# Patient Record
Sex: Female | Born: 1990 | Race: White | Hispanic: No | Marital: Single | Smoking: Never smoker
Health system: Southern US, Community
[De-identification: ages and names within clinical notes are randomized; demographics above are authoritative.]

## PROBLEM LIST (undated history)

## (undated) DIAGNOSIS — F329 Major depressive disorder, single episode, unspecified: Secondary | ICD-10-CM

## (undated) DIAGNOSIS — F32A Depression, unspecified: Secondary | ICD-10-CM

---

## 2014-12-13 ENCOUNTER — Emergency Department (HOSPITAL_BASED_OUTPATIENT_CLINIC_OR_DEPARTMENT_OTHER): Payer: 59

## 2014-12-13 ENCOUNTER — Emergency Department (HOSPITAL_BASED_OUTPATIENT_CLINIC_OR_DEPARTMENT_OTHER)
Admission: EM | Admit: 2014-12-13 | Discharge: 2014-12-13 | Disposition: A | Discharge status: Home / Self Care | Payer: 59 | Attending: Emergency Medicine | Admitting: Emergency Medicine

## 2014-12-13 ENCOUNTER — Encounter (HOSPITAL_BASED_OUTPATIENT_CLINIC_OR_DEPARTMENT_OTHER): Payer: Self-pay

## 2014-12-13 DIAGNOSIS — Y9389 Activity, other specified: Secondary | ICD-10-CM | POA: Diagnosis not present

## 2014-12-13 DIAGNOSIS — W01198A Fall on same level from slipping, tripping and stumbling with subsequent striking against other object, initial encounter: Secondary | ICD-10-CM | POA: Insufficient documentation

## 2014-12-13 DIAGNOSIS — Z79899 Other long term (current) drug therapy: Secondary | ICD-10-CM | POA: Insufficient documentation

## 2014-12-13 DIAGNOSIS — S0990XA Unspecified injury of head, initial encounter: Secondary | ICD-10-CM | POA: Insufficient documentation

## 2014-12-13 DIAGNOSIS — S161XXA Strain of muscle, fascia and tendon at neck level, initial encounter: Secondary | ICD-10-CM | POA: Insufficient documentation

## 2014-12-13 DIAGNOSIS — Y9289 Other specified places as the place of occurrence of the external cause: Secondary | ICD-10-CM | POA: Diagnosis not present

## 2014-12-13 DIAGNOSIS — Y998 Other external cause status: Secondary | ICD-10-CM | POA: Insufficient documentation

## 2014-12-13 DIAGNOSIS — Z8659 Personal history of other mental and behavioral disorders: Secondary | ICD-10-CM | POA: Insufficient documentation

## 2014-12-13 HISTORY — DX: Depression, unspecified: F32.A

## 2014-12-13 HISTORY — DX: Major depressive disorder, single episode, unspecified: F32.9

## 2014-12-13 MED ORDER — ONDANSETRON 4 MG PO TBDP
4.0000 mg | ORAL_TABLET | Freq: Once | ORAL | Status: AC
  Administered 2014-12-13: 4 mg via ORAL
  Filled 2014-12-13: qty 1

## 2014-12-13 MED ORDER — ONDANSETRON 4 MG PO TBDP: ORAL_TABLET | ORAL | Status: AC

## 2014-12-13 NOTE — ED Notes (Signed)
Patient transported to CT 

## 2014-12-13 NOTE — Discharge Instructions (Signed)
Concussion  A concussion, or closed-head injury, is a brain injury caused by a direct blow to the head or by a quick and sudden movement (jolt) of the head or neck. Concussions are usually not life-threatening. Even so, the effects of a concussion can be serious. If you have had a concussion before, you are more likely to experience concussion-like symptoms after a direct blow to the head.   CAUSES  · Direct blow to the head, such as from running into another player during a soccer game, being hit in a fight, or hitting your head on a hard surface.  · A jolt of the head or neck that causes the brain to move back and forth inside the skull, such as in a car crash.  SIGNS AND SYMPTOMS  The signs of a concussion can be hard to notice. Early on, they may be missed by you, family members, and health care providers. You may look fine but act or feel differently.  Symptoms are usually temporary, but they may last for days, weeks, or even longer. Some symptoms may appear right away while others may not show up for hours or days. Every head injury is different. Symptoms include:  · Mild to moderate headaches that will not go away.  · A feeling of pressure inside your head.  · Having more trouble than usual:  ¨ Learning or remembering things you have heard.  ¨ Answering questions.  ¨ Paying attention or concentrating.  ¨ Organizing daily tasks.  ¨ Making decisions and solving problems.  · Slowness in thinking, acting or reacting, speaking, or reading.  · Getting lost or being easily confused.  · Feeling tired all the time or lacking energy (fatigued).  · Feeling drowsy.  · Sleep disturbances.  ¨ Sleeping more than usual.  ¨ Sleeping less than usual.  ¨ Trouble falling asleep.  ¨ Trouble sleeping (insomnia).  · Loss of balance or feeling lightheaded or dizzy.  · Nausea or vomiting.  · Numbness or tingling.  · Increased sensitivity to:  ¨ Sounds.  ¨ Lights.  ¨ Distractions.  · Vision problems or eyes that tire  easily.  · Diminished sense of taste or smell.  · Ringing in the ears.  · Mood changes such as feeling sad or anxious.  · Becoming easily irritated or angry for little or no reason.  · Lack of motivation.  · Seeing or hearing things other people do not see or hear (hallucinations).  DIAGNOSIS  Your health care provider can usually diagnose a concussion based on a description of your injury and symptoms. He or she will ask whether you passed out (lost consciousness) and whether you are having trouble remembering events that happened right before and during your injury.  Your evaluation might include:  · A brain scan to look for signs of injury to the brain. Even if the test shows no injury, you may still have a concussion.  · Blood tests to be sure other problems are not present.  TREATMENT  · Concussions are usually treated in an emergency department, in urgent care, or at a clinic. You may need to stay in the hospital overnight for further treatment.  · Tell your health care provider if you are taking any medicines, including prescription medicines, over-the-counter medicines, and natural remedies. Some medicines, such as blood thinners (anticoagulants) and aspirin, may increase the chance of complications. Also tell your health care provider whether you have had alcohol or are taking illegal drugs. This information   may affect treatment.  · Your health care provider will send you home with important instructions to follow.  · How fast you will recover from a concussion depends on many factors. These factors include how severe your concussion is, what part of your brain was injured, your age, and how healthy you were before the concussion.  · Most people with mild injuries recover fully. Recovery can take time. In general, recovery is slower in older persons. Also, persons who have had a concussion in the past or have other medical problems may find that it takes longer to recover from their current injury.  HOME  CARE INSTRUCTIONS  General Instructions  · Carefully follow the directions your health care provider gave you.  · Only take over-the-counter or prescription medicines for pain, discomfort, or fever as directed by your health care provider.  · Take only those medicines that your health care provider has approved.  · Do not drink alcohol until your health care provider says you are well enough to do so. Alcohol and certain other drugs may slow your recovery and can put you at risk of further injury.  · If it is harder than usual to remember things, write them down.  · If you are easily distracted, try to do one thing at a time. For example, do not try to watch TV while fixing dinner.  · Talk with family members or close friends when making important decisions.  · Keep all follow-up appointments. Repeated evaluation of your symptoms is recommended for your recovery.  · Watch your symptoms and tell others to do the same. Complications sometimes occur after a concussion. Older adults with a brain injury may have a higher risk of serious complications, such as a blood clot on the brain.  · Tell your teachers, school nurse, school counselor, coach, athletic trainer, or work manager about your injury, symptoms, and restrictions. Tell them about what you can or cannot do. They should watch for:  ¨ Increased problems with attention or concentration.  ¨ Increased difficulty remembering or learning new information.  ¨ Increased time needed to complete tasks or assignments.  ¨ Increased irritability or decreased ability to cope with stress.  ¨ Increased symptoms.  · Rest. Rest helps the brain to heal. Make sure you:  ¨ Get plenty of sleep at night. Avoid staying up late at night.  ¨ Keep the same bedtime hours on weekends and weekdays.  ¨ Rest during the day. Take daytime naps or rest breaks when you feel tired.  · Limit activities that require a lot of thought or concentration. These include:  ¨ Doing homework or job-related  work.  ¨ Watching TV.  ¨ Working on the computer.  · Avoid any situation where there is potential for another head injury (football, hockey, soccer, basketball, martial arts, downhill snow sports and horseback riding). Your condition will get worse every time you experience a concussion. You should avoid these activities until you are evaluated by the appropriate follow-up health care providers.  Returning To Your Regular Activities  You will need to return to your normal activities slowly, not all at once. You must give your body and brain enough time for recovery.  · Do not return to sports or other athletic activities until your health care provider tells you it is safe to do so.  · Ask your health care provider when you can drive, ride a bicycle, or operate heavy machinery. Your ability to react may be slower after a   brain injury. Never do these activities if you are dizzy.  · Ask your health care provider about when you can return to work or school.  Preventing Another Concussion  It is very important to avoid another brain injury, especially before you have recovered. In rare cases, another injury can lead to permanent brain damage, brain swelling, or death. The risk of this is greatest during the first 7-10 days after a head injury. Avoid injuries by:  · Wearing a seat belt when riding in a car.  · Drinking alcohol only in moderation.  · Wearing a helmet when biking, skiing, skateboarding, skating, or doing similar activities.  · Avoiding activities that could lead to a second concussion, such as contact or recreational sports, until your health care provider says it is okay.  · Taking safety measures in your home.  ¨ Remove clutter and tripping hazards from floors and stairways.  ¨ Use grab bars in bathrooms and handrails by stairs.  ¨ Place non-slip mats on floors and in bathtubs.  ¨ Improve lighting in dim areas.  SEEK MEDICAL CARE IF:  · You have increased problems paying attention or  concentrating.  · You have increased difficulty remembering or learning new information.  · You need more time to complete tasks or assignments than before.  · You have increased irritability or decreased ability to cope with stress.  · You have more symptoms than before.  Seek medical care if you have any of the following symptoms for more than 2 weeks after your injury:  · Lasting (chronic) headaches.  · Dizziness or balance problems.  · Nausea.  · Vision problems.  · Increased sensitivity to noise or light.  · Depression or mood swings.  · Anxiety or irritability.  · Memory problems.  · Difficulty concentrating or paying attention.  · Sleep problems.  · Feeling tired all the time.  SEEK IMMEDIATE MEDICAL CARE IF:  · You have severe or worsening headaches. These may be a sign of a blood clot in the brain.  · You have weakness (even if only in one hand, leg, or part of the face).  · You have numbness.  · You have decreased coordination.  · You vomit repeatedly.  · You have increased sleepiness.  · One pupil is larger than the other.  · You have convulsions.  · You have slurred speech.  · You have increased confusion. This may be a sign of a blood clot in the brain.  · You have increased restlessness, agitation, or irritability.  · You are unable to recognize people or places.  · You have neck pain.  · It is difficult to wake you up.  · You have unusual behavior changes.  · You lose consciousness.  MAKE SURE YOU:  · Understand these instructions.  · Will watch your condition.  · Will get help right away if you are not doing well or get worse.  Document Released: 12/23/2003 Document Revised: 10/07/2013 Document Reviewed: 04/24/2013  [employer]® Patient Information ©2015 [employer]. This information is not intended to replace advice given to you by your health care provider. Make sure you discuss any questions you have with your health care provider.

## 2014-12-13 NOTE — ED Notes (Signed)
Patient reports after seeing chiropractor she fell and hit her head Friday evening. Reports ongoing nausea and occipital head pain. Last pm vomited and continues to feel bad with nausea

## 2014-12-13 NOTE — ED Provider Notes (Signed)
CSN: [id number]     Arrival date & time 12/13/14  1228 History   First MD Initiated Contact with Patient 12/13/14 1246     Chief Complaint  Patient presents with  . Head Injury     (Consider location/radiation/quality/duration/timing/severity/associated sxs/prior Treatment) HPI Comments: Patient presents with headache and nausea. She states that 2 days ago she tripped and fell backwards hitting her head on an open animal cage. She had some minor pain to her occiput area. Yesterday she had some worsening headache with associated nausea. She states it got worse today and she felt like she was given pass out. She states nausea gets worse when she stands up. She has some pain down into her neck. She has a little bit of lightheadedness. She denies any vertigo symptoms. She denies any ataxia. She denies any vision changes. She denies any numbness or weakness to her extremities. She denies any other injuries from the fall. She vomited one time last night.  Patient is a 24 y.o. female presenting with head injury.  Head Injury Associated symptoms: headache, nausea, neck pain and vomiting   Associated symptoms: no numbness     Past Medical History  Diagnosis Date  . Depression    History reviewed. No pertinent past surgical history. No family history on file. History  Substance Use Topics  . Smoking status: Never Smoker   . Smokeless tobacco: Not on file  . Alcohol Use: Not on file   OB History    No data available     Review of Systems  Constitutional: Positive for fatigue. Negative for fever, chills and diaphoresis.  HENT: Negative for congestion, rhinorrhea and sneezing.   Eyes: Negative.   Respiratory: Negative for cough, chest tightness and shortness of breath.   Cardiovascular: Negative for chest pain and leg swelling.  Gastrointestinal: Positive for nausea and vomiting. Negative for abdominal pain, diarrhea and blood in stool.  Genitourinary: Negative for frequency, hematuria,  flank pain and difficulty urinating.  Musculoskeletal: Positive for neck pain. Negative for back pain and arthralgias.  Skin: Negative for rash.  Neurological: Positive for light-headedness and headaches. Negative for dizziness, speech difficulty, weakness and numbness.      Allergies  Review of patient's allergies indicates no known allergies.  Home Medications   Prior to Admission medications   Medication Sig Start Date End Date Taking? Authorizing Provider  norethindrone-ethinyl estradiol (JUNEL FE,GILDESS FE,LOESTRIN FE) 1-20 MG-MCG tablet Take 1 tablet by mouth daily.   Yes Historical Provider, MD  ondansetron (ZOFRAN ODT) 4 MG disintegrating tablet 4mg  ODT q4 hours prn nausea/vomit 12/13/14   Rolan BuccoMelanie Khayman Kirsch, MD  sertraline (ZOLOFT) 100 MG tablet Take 100 mg by mouth daily.   Yes Historical Provider, MD   BP 129/85 mmHg  Pulse 99  Temp(Src) 98.8 F (37.1 C) (Oral)  Resp 16  SpO2 100%  LMP 11/15/2014 Physical Exam  Constitutional: She is oriented to person, place, and time. She appears well-developed and well-nourished.  HENT:  Head: Normocephalic.  Right Ear: External ear normal.  Left Ear: External ear normal.  Mild tenderness across the posterior scalp area.  Eyes: Pupils are equal, round, and reactive to light.  Neck:  Positive tenderness to the upper cervical spine. No step-offs or deformities are noted. No pain to the thoracic or lumbosacral spine  Cardiovascular: Normal rate, regular rhythm and normal heart sounds.   Pulmonary/Chest: Effort normal and breath sounds normal. No respiratory distress. She has no wheezes. She has no rales. She exhibits no tenderness.  Abdominal: Soft. Bowel sounds are normal. There is no tenderness. There is no rebound and no guarding.  Musculoskeletal: Normal range of motion. She exhibits no edema.  Lymphadenopathy:    She has no cervical adenopathy.  Neurological: She is alert and oriented to person, place, and time. She has normal  strength. No cranial nerve deficit or sensory deficit. GCS eye subscore is 4. GCS verbal subscore is 5. GCS motor subscore is 6.  Skin: Skin is warm and dry. No rash noted.  Psychiatric: She has a normal mood and affect.    ED Course  Procedures (including critical care time) Labs Review Labs Reviewed - No data to display  Imaging Review Ct Head Wo Contrast  12/13/2014   CLINICAL DATA:  24 year old female status post fall striking the back of her head. She has occipital headache and neck pain.  EXAM: CT HEAD WITHOUT CONTRAST  CT CERVICAL SPINE WITHOUT CONTRAST  TECHNIQUE: Multidetector CT imaging of the head and cervical spine was performed following the standard protocol without intravenous contrast. Multiplanar CT image reconstructions of the cervical spine were also generated.  COMPARISON:  None.  FINDINGS: CT HEAD FINDINGS  Negative for acute intracranial hemorrhage, acute infarction, mass, mass effect, hydrocephalus or midline shift. Gray-white differentiation is preserved throughout. No focal soft tissue or calvarial abnormality. Globes and orbits are symmetric and unremarkable bilaterally. Normal aeration of the mastoid air cells and visualized paranasal sinuses.  CT CERVICAL SPINE FINDINGS  No acute fracture, malalignment or prevertebral soft tissue swelling. Unremarkable CT appearance of the thyroid gland. No acute soft tissue abnormality. The lung apices are unremarkable.  IMPRESSION: CT HEAD  1. Negative CT CSPINE  1. Negative   Electronically Signed   By: Malachy Moan M.D.   On: 12/13/2014 13:55   Ct Cervical Spine Wo Contrast  12/13/2014   CLINICAL DATA:  24 year old female status post fall striking the back of her head. She has occipital headache and neck pain.  EXAM: CT HEAD WITHOUT CONTRAST  CT CERVICAL SPINE WITHOUT CONTRAST  TECHNIQUE: Multidetector CT imaging of the head and cervical spine was performed following the standard protocol without intravenous contrast. Multiplanar  CT image reconstructions of the cervical spine were also generated.  COMPARISON:  None.  FINDINGS: CT HEAD FINDINGS  Negative for acute intracranial hemorrhage, acute infarction, mass, mass effect, hydrocephalus or midline shift. Gray-white differentiation is preserved throughout. No focal soft tissue or calvarial abnormality. Globes and orbits are symmetric and unremarkable bilaterally. Normal aeration of the mastoid air cells and visualized paranasal sinuses.  CT CERVICAL SPINE FINDINGS  No acute fracture, malalignment or prevertebral soft tissue swelling. Unremarkable CT appearance of the thyroid gland. No acute soft tissue abnormality. The lung apices are unremarkable.  IMPRESSION: CT HEAD  1. Negative CT CSPINE  1. Negative   Electronically Signed   By: Malachy Moan M.D.   On: 12/13/2014 13:55     EKG Interpretation None      MDM   Final diagnoses:  Head injury, initial encounter  Neck strain, initial encounter    Pt feeling better after some zofran.  Does not want anything for her headache.  No evidence of ICH.  No evidence of cervical injury.  No other assoicated neuro deficits.  No cranial nerve abnormalities.  Given concussion instructions.  Advised to f/u this week with her PMD in [place].  Advised to return to the ED if her symptoms worsen.    Rolan Bucco, MD 12/13/14 [id number]

## 2016-06-18 IMAGING — CT CT HEAD W/O CM
3 series · 17 of 37 positions shown, 19 images · non-contrast
Comparison: None.

CLINICAL DATA: 23-year-old female status post fall striking the
back of her head. She has occipital headache and neck pain.

EXAM:
CT HEAD WITHOUT CONTRAST
CT CERVICAL SPINE WITHOUT CONTRAST
TECHNIQUE: Multidetector CT imaging of the head and cervical spine was
performed following the standard protocol without intravenous
contrast. Multiplanar CT image reconstructions of the cervical spine
were also generated.

[Series 2: head 4.8 h37s · axial · 0.45mm/px · z∈[-162,-55]mm · 6 of 32 slices shown, 8 images]
[im 5/32  brain]
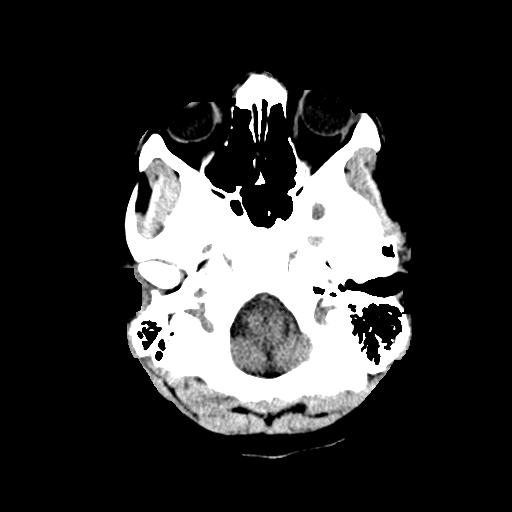
[im 5/32  bone]
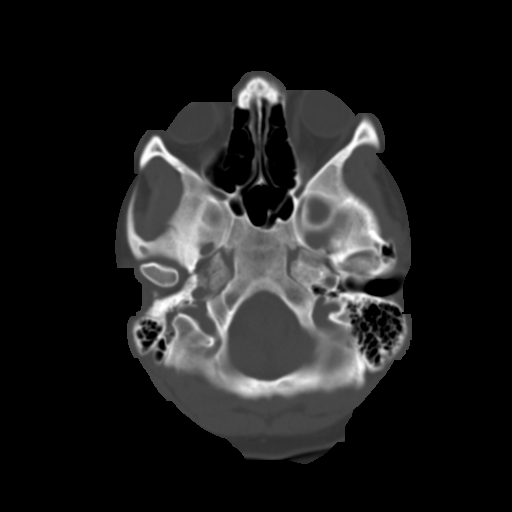
[im 9/32  brain]
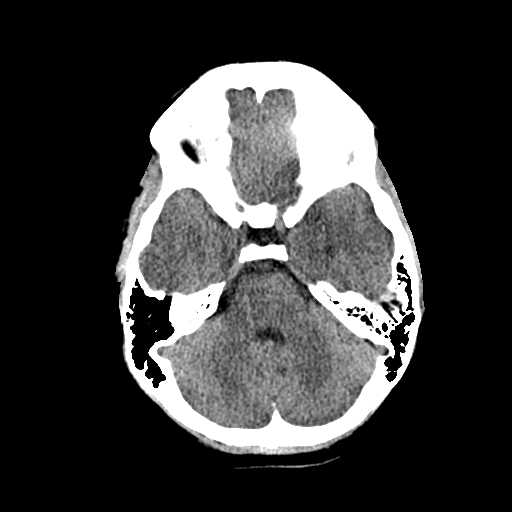
[im 14/32  brain]
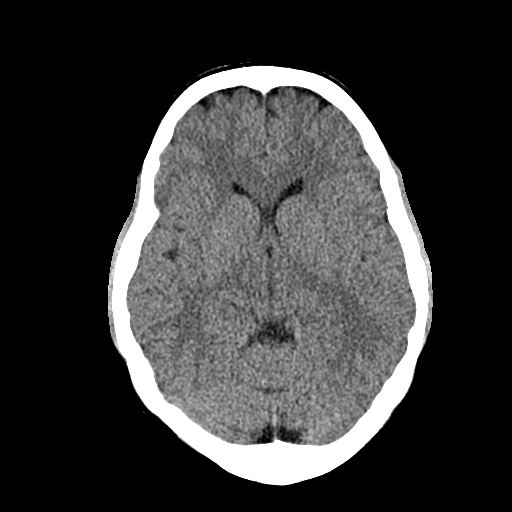
[im 18/32  brain]
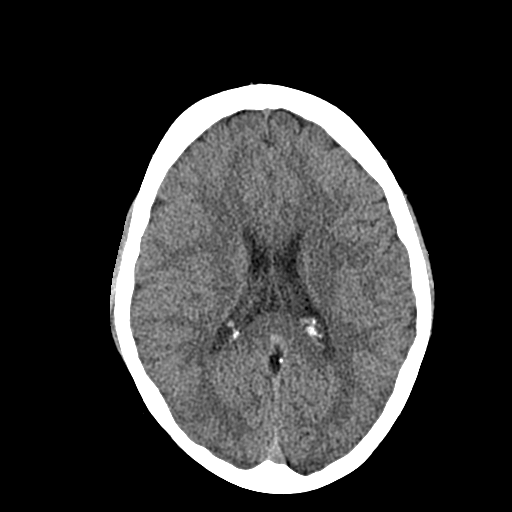
[im 23/32  brain]
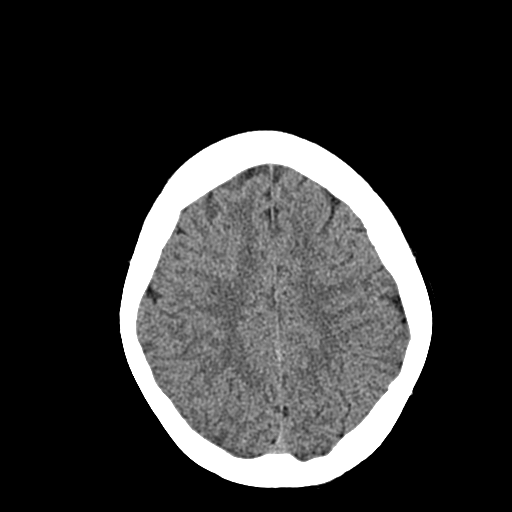
[im 23/32  bone]
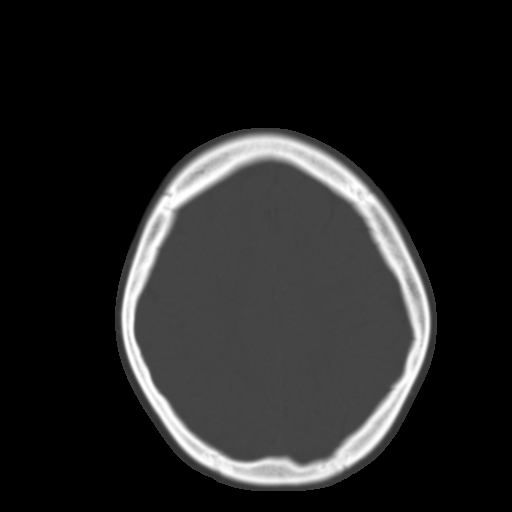
[im 27/32  brain]
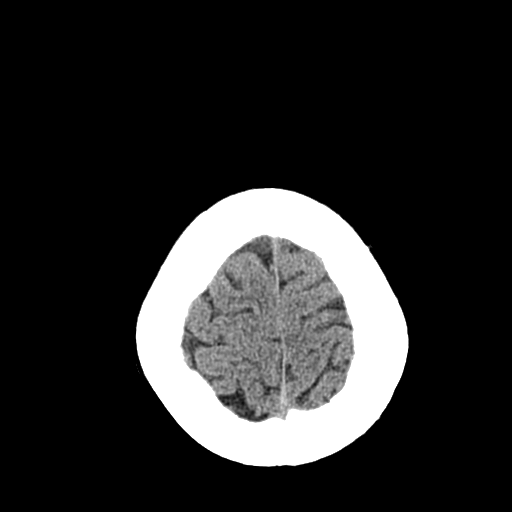

[Series 5: c_spine 2.0 b41s st · axial · 0.23mm/px · z∈[-336,-200]mm · 8 of 86 slices shown]
[im 9/86  brain]
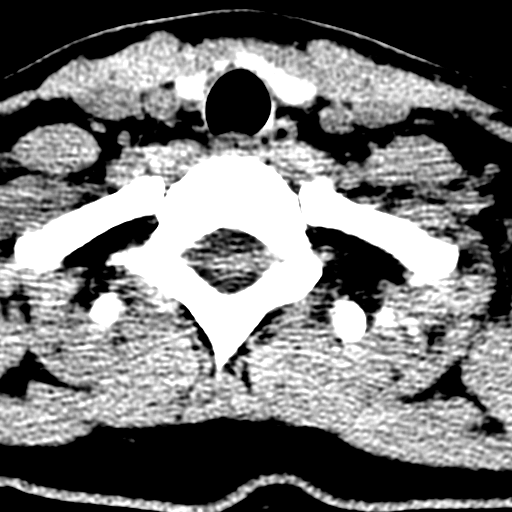
[im 17/86  brain]
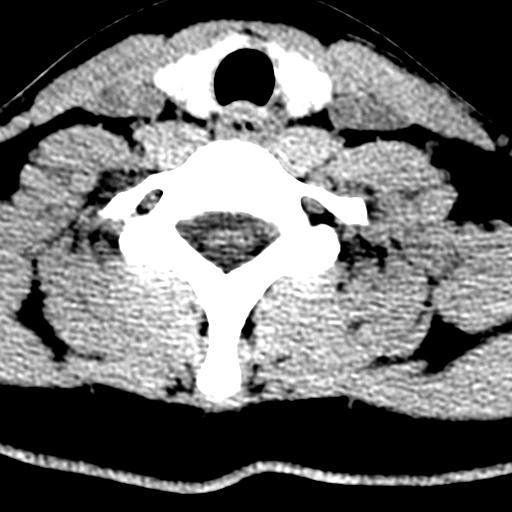
[im 29/86  brain]
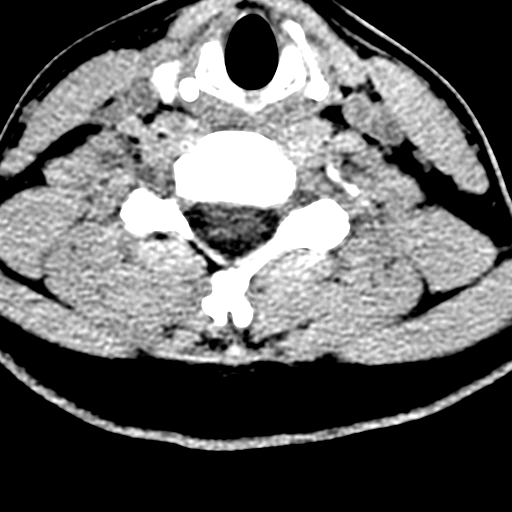
[im 37/86  brain]
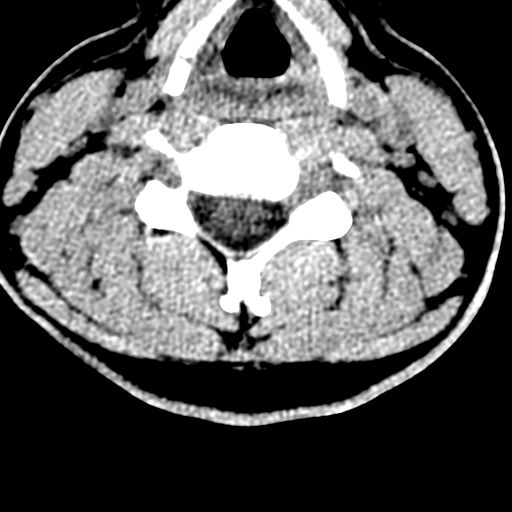
[im 49/86  brain]
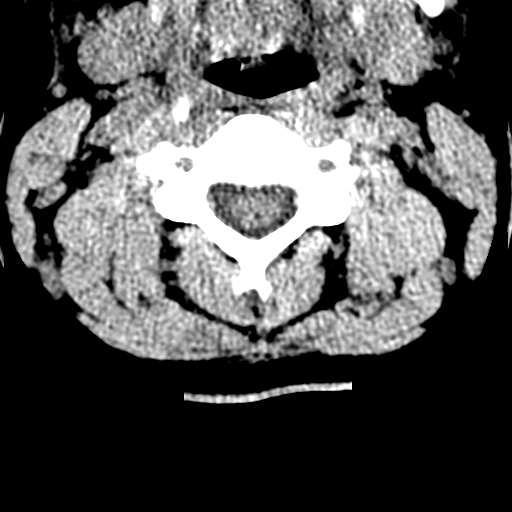
[im 57/86  brain]
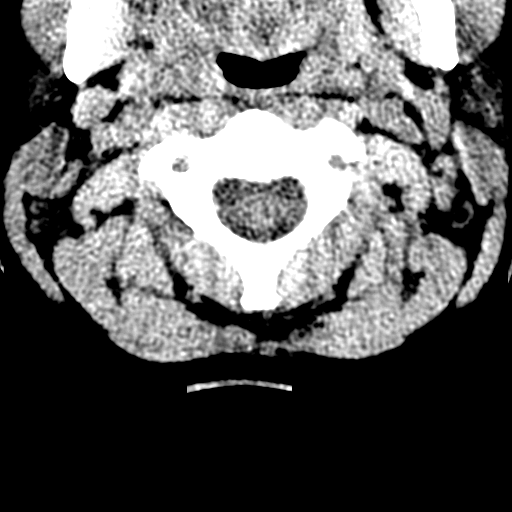
[im 69/86  brain]
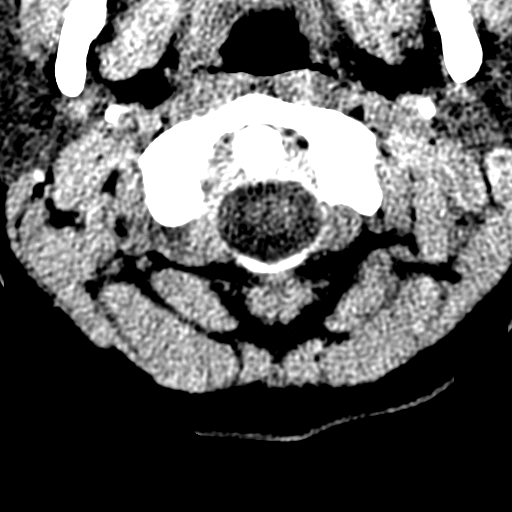
[im 77/86  brain]
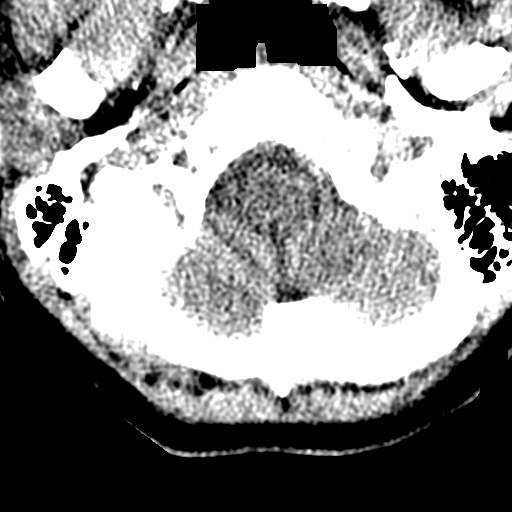

[Series 8: c_spine 2.0 sagittal · sagittal · 0.33mm/px · 3 of 38 slices shown]
[im 13/38  brain]
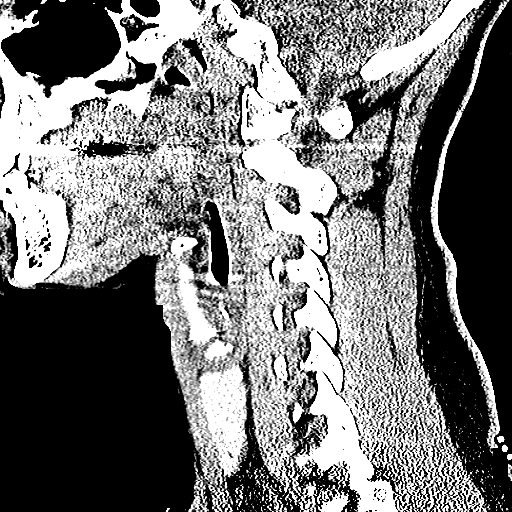
[im 19/38  brain]
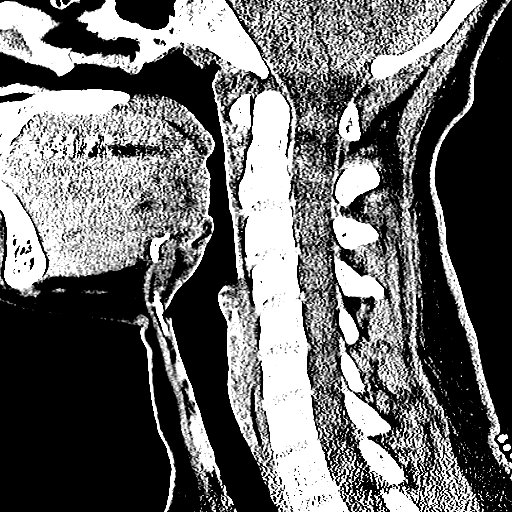
[im 25/38  brain]
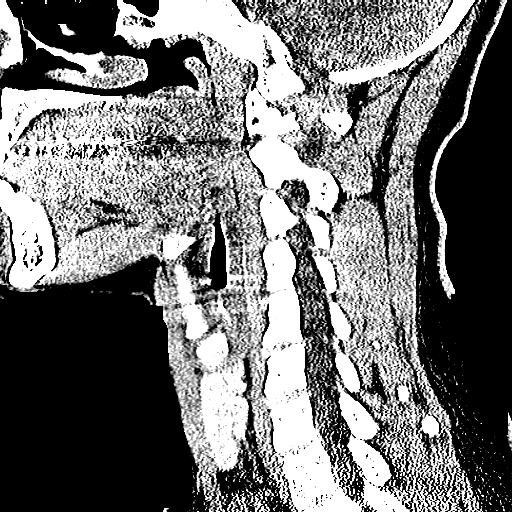

[17 of 37 positions shown; findings below may reference images not displayed]

FINDINGS: CT HEAD FINDINGS

Negative for acute intracranial hemorrhage, acute infarction, mass,
mass effect, hydrocephalus or midline shift. Gray-white
differentiation is preserved throughout. No focal soft tissue or
calvarial abnormality. Globes and orbits are symmetric and
unremarkable bilaterally. Normal aeration of the mastoid air cells
and visualized paranasal sinuses.

CT CERVICAL SPINE FINDINGS

No acute fracture, malalignment or prevertebral soft tissue
swelling. Unremarkable CT appearance of the thyroid gland. No acute
soft tissue abnormality. The lung apices are unremarkable.
IMPRESSION: CT HEAD

1. Negative
CT CSPINE

1. Negative
